# Patient Record
Sex: Female | Born: 1940 | Race: Black or African American | Hispanic: No | Smoking: Never smoker
Health system: Southern US, Community
[De-identification: ages and names within clinical notes are randomized; demographics above are authoritative.]

## PROBLEM LIST (undated history)

## (undated) DIAGNOSIS — I10 Essential (primary) hypertension: Secondary | ICD-10-CM

---

## 1997-08-12 ENCOUNTER — Other Ambulatory Visit: Admission: RE | Admit: 1997-08-12 | Discharge: 1997-08-12 | Payer: Self-pay | Admitting: *Deleted

## 1998-09-05 ENCOUNTER — Other Ambulatory Visit: Admission: RE | Admit: 1998-09-05 | Discharge: 1998-09-05 | Payer: Self-pay | Admitting: *Deleted

## 1999-08-21 ENCOUNTER — Encounter: Payer: Self-pay | Admitting: *Deleted

## 1999-08-21 ENCOUNTER — Encounter: Admission: RE | Admit: 1999-08-21 | Discharge: 1999-08-21 | Payer: Self-pay | Admitting: *Deleted

## 1999-09-28 ENCOUNTER — Other Ambulatory Visit: Admission: RE | Admit: 1999-09-28 | Discharge: 1999-09-28 | Payer: Self-pay | Admitting: *Deleted

## 1999-11-24 ENCOUNTER — Ambulatory Visit (HOSPITAL_COMMUNITY): Admission: RE | Admit: 1999-11-24 | Discharge: 1999-11-24 | Payer: Self-pay | Admitting: Gastroenterology

## 1999-11-24 ENCOUNTER — Encounter (INDEPENDENT_AMBULATORY_CARE_PROVIDER_SITE_OTHER): Payer: Self-pay | Admitting: *Deleted

## 2000-08-07 ENCOUNTER — Other Ambulatory Visit: Admission: RE | Admit: 2000-08-07 | Discharge: 2000-08-07 | Payer: Self-pay | Admitting: *Deleted

## 2000-08-22 ENCOUNTER — Encounter: Payer: Self-pay | Admitting: *Deleted

## 2000-08-22 ENCOUNTER — Encounter: Admission: RE | Admit: 2000-08-22 | Discharge: 2000-08-22 | Payer: Self-pay | Admitting: *Deleted

## 2001-08-26 ENCOUNTER — Encounter: Payer: Self-pay | Admitting: Family Medicine

## 2001-08-26 ENCOUNTER — Encounter: Admission: RE | Admit: 2001-08-26 | Discharge: 2001-08-26 | Payer: Self-pay | Admitting: Family Medicine

## 2001-08-27 ENCOUNTER — Other Ambulatory Visit: Admission: RE | Admit: 2001-08-27 | Discharge: 2001-08-27 | Payer: Self-pay | Admitting: *Deleted

## 2002-09-04 ENCOUNTER — Encounter: Admission: RE | Admit: 2002-09-04 | Discharge: 2002-09-04 | Payer: Self-pay | Admitting: Family Medicine

## 2002-09-04 ENCOUNTER — Encounter: Payer: Self-pay | Admitting: Family Medicine

## 2002-09-07 ENCOUNTER — Other Ambulatory Visit: Admission: RE | Admit: 2002-09-07 | Discharge: 2002-09-07 | Payer: Self-pay | Admitting: *Deleted

## 2003-03-22 ENCOUNTER — Ambulatory Visit (HOSPITAL_COMMUNITY): Admission: RE | Admit: 2003-03-22 | Discharge: 2003-03-22 | Payer: Self-pay | Admitting: Gastroenterology

## 2003-09-07 ENCOUNTER — Other Ambulatory Visit: Admission: RE | Admit: 2003-09-07 | Discharge: 2003-09-07 | Payer: Self-pay | Admitting: *Deleted

## 2003-09-14 ENCOUNTER — Encounter: Admission: RE | Admit: 2003-09-14 | Discharge: 2003-09-14 | Payer: Self-pay | Admitting: *Deleted

## 2004-08-30 ENCOUNTER — Other Ambulatory Visit: Admission: RE | Admit: 2004-08-30 | Discharge: 2004-08-30 | Payer: Self-pay | Admitting: *Deleted

## 2004-11-01 ENCOUNTER — Encounter: Admission: RE | Admit: 2004-11-01 | Discharge: 2004-11-01 | Payer: Self-pay | Admitting: *Deleted

## 2005-09-04 ENCOUNTER — Other Ambulatory Visit: Admission: RE | Admit: 2005-09-04 | Discharge: 2005-09-04 | Payer: Self-pay | Admitting: *Deleted

## 2005-12-12 ENCOUNTER — Ambulatory Visit: Payer: Self-pay | Admitting: Internal Medicine

## 2006-01-09 ENCOUNTER — Encounter: Payer: Self-pay | Admitting: Cardiology

## 2006-01-09 ENCOUNTER — Ambulatory Visit: Payer: Self-pay

## 2006-01-14 ENCOUNTER — Encounter: Admission: RE | Admit: 2006-01-14 | Discharge: 2006-01-14 | Payer: Self-pay | Admitting: *Deleted

## 2006-09-19 ENCOUNTER — Other Ambulatory Visit: Admission: RE | Admit: 2006-09-19 | Discharge: 2006-09-19 | Payer: Self-pay | Admitting: *Deleted

## 2007-01-16 ENCOUNTER — Encounter: Admission: RE | Admit: 2007-01-16 | Discharge: 2007-01-16 | Payer: Self-pay | Admitting: *Deleted

## 2007-02-25 ENCOUNTER — Ambulatory Visit: Payer: Self-pay | Admitting: Internal Medicine

## 2007-02-25 DIAGNOSIS — R209 Unspecified disturbances of skin sensation: Secondary | ICD-10-CM

## 2007-02-25 DIAGNOSIS — Z860100 Personal history of colon polyps, unspecified: Secondary | ICD-10-CM | POA: Insufficient documentation

## 2007-02-25 DIAGNOSIS — E785 Hyperlipidemia, unspecified: Secondary | ICD-10-CM | POA: Insufficient documentation

## 2007-02-25 DIAGNOSIS — R5381 Other malaise: Secondary | ICD-10-CM | POA: Insufficient documentation

## 2007-02-25 DIAGNOSIS — R5383 Other fatigue: Secondary | ICD-10-CM | POA: Insufficient documentation

## 2007-02-25 DIAGNOSIS — K5730 Diverticulosis of large intestine without perforation or abscess without bleeding: Secondary | ICD-10-CM | POA: Insufficient documentation

## 2007-02-25 LAB — CONVERTED CEMR LAB
BUN: 12 mg/dL (ref 6–23)
Basophils Absolute: 0 10*3/uL (ref 0.0–0.1)
Calcium: 9 mg/dL (ref 8.4–10.5)
Chloride: 107 meq/L (ref 96–112)
Eosinophils Relative: 5.1 % — ABNORMAL HIGH (ref 0.0–5.0)
Folate: 10.1 ng/mL
Hemoglobin: 14.1 g/dL (ref 12.0–15.0)
MCHC: 34.9 g/dL (ref 30.0–36.0)
MCV: 90.2 fL (ref 78.0–100.0)
Monocytes Absolute: 0.2 10*3/uL (ref 0.2–0.7)
Monocytes Relative: 6.4 % (ref 3.0–11.0)
Neutro Abs: 1.7 10*3/uL (ref 1.4–7.7)
Neutrophils Relative %: 47.9 % (ref 43.0–77.0)
Platelets: 166 10*3/uL (ref 150–400)
Potassium: 4.2 meq/L (ref 3.5–5.1)
RBC: 4.49 M/uL (ref 3.87–5.11)
Sodium: 141 meq/L (ref 135–145)
TSH: 2.56 microintl units/mL (ref 0.35–5.50)
Total Bilirubin: 0.5 mg/dL (ref 0.3–1.2)
Total CHOL/HDL Ratio: 4.4
Triglycerides: 82 mg/dL (ref 0–149)
Vitamin B-12: 646 pg/mL (ref 211–911)

## 2007-07-10 ENCOUNTER — Ambulatory Visit: Payer: Self-pay | Admitting: Internal Medicine

## 2007-07-10 ENCOUNTER — Telehealth (INDEPENDENT_AMBULATORY_CARE_PROVIDER_SITE_OTHER): Payer: Self-pay | Admitting: *Deleted

## 2007-07-10 DIAGNOSIS — R21 Rash and other nonspecific skin eruption: Secondary | ICD-10-CM | POA: Insufficient documentation

## 2007-09-29 ENCOUNTER — Other Ambulatory Visit: Admission: RE | Admit: 2007-09-29 | Discharge: 2007-09-29 | Payer: Self-pay | Admitting: Gynecology

## 2008-01-27 ENCOUNTER — Encounter: Admission: RE | Admit: 2008-01-27 | Discharge: 2008-01-27 | Payer: Self-pay | Admitting: Geriatric Medicine

## 2010-03-03 ENCOUNTER — Emergency Department (HOSPITAL_COMMUNITY)
Admission: EM | Admit: 2010-03-03 | Discharge: 2010-03-03 | Payer: Self-pay | Source: Home / Self Care | Admitting: Emergency Medicine

## 2010-03-19 ENCOUNTER — Encounter: Payer: Self-pay | Admitting: Geriatric Medicine

## 2010-07-14 NOTE — Op Note (Signed)
NAMEJAISA, DEFINO                          ACCOUNT NO.:  [id number]   MEDICAL RECORD NO.:  [id number]                   PATIENT TYPE:  AMB   LOCATION:  ENDO                                 FACILITY:  [facility]   PHYSICIAN:  John C. Madilyn Fireman, M.D.                 DATE OF BIRTH:  06-13-1940   DATE OF PROCEDURE:  03/22/2003  DATE OF DISCHARGE:                                 OPERATIVE REPORT   PROCEDURE:  Colonoscopy.   INDICATIONS FOR PROCEDURE:  History of adenomatous colon polyp.   DESCRIPTION OF PROCEDURE:  The patient was placed in the left lateral  decubitus position and placed on the pulse monitor with continuous low-flow  oxygen delivered by nasal cannula.  She was sedated with 87.5 mcg IV  fentanyl and 8 mg IV Versed.  The Olympus video colonoscope was inserted  into the rectum and advanced to the cecum, confirmed by transillumination of  McBurney's point and visualization of the ileocecal valve and appendiceal  orifice.  Prep was excellent.  The cecum appeared normal with no masses,  polyps, diverticula, or other mucosal abnormalities.  There were a few  ascending colon diverticula seen.  Otherwise no masses, polyps or other  abnormalities.  The transverse and descending colon appeared normal.  There  were a few more diverticula seen in the sigmoid colon.  The rectum appeared  normal and retroflexed view of the anus revealed no obvious internal  hemorrhoids.  The scope was then withdrawn and the patient returned to the  recovery room in stable condition.  She tolerated the procedure well and  there were no immediate complications.   IMPRESSION:  Diverticulosis; otherwise normal study.   PLAN:  Repeat colonoscopy in five years.                                               John C. Madilyn Fireman, M.D.    J[facility]/MEDQ  D:  03/22/2003  T:  03/22/2003  Job:  [id number]   cc:   Meredith Staggers, M.D.  [address]  [place]  [place] [address]  Fax: [phone]

## 2010-07-14 NOTE — Procedures (Signed)
[facility]  Patient:    Crystal Horton, Crystal Horton                       MRN: [id number] Proc. Date: 11/24/99 Adm. Date:  [id number] Attending:  Louie Bun CC:         Willis Modena. Dreiling, M.D.   Procedure Report  PROCEDURE:  Colonoscopy with polypectomy.  INDICATION FOR PROCEDURE:  Change in bowel habits and heme positive stool.  DESCRIPTION OF PROCEDURE:  The patient was placed in the left lateral decubitus position and placed on the pulse monitor with continuous low flow oxygen delivered by nasal cannula. She was sedated with 70 mg IV Demerol and 7 mg IV Versed. The Olympus video colonoscope was inserted into the rectum and advanced to the cecum, confirmed by transillumination at McBurneys point and visualization of the ileocecal valve and appendiceal orifice. The prep was good. Within the base of the cecum was seen a 1 cm sessile polyp which was removed by snare. The remainder of the cecum, ascending, transverse, descending and sigmoid colon appeared normal with no further masses, polyps, diverticula or other mucosal abnormalities. Within the rectum was seen a second similar 1 cm sessile polyp which was also removed by snare. The remainder of the rectum appeared normal and retroflexed view of the anus revealed no obvious internal hemorrhoids. The colonoscope was then withdrawn and the patient returned to the recovery room in stable condition. The patient tolerated the procedure well and there were no immediate complications.  IMPRESSION:  Cecal and ascending colon polyps.  PLAN:  Await histology for determination of need for future surveillance colonoscopies. DD:  11/24/99 TD:  11/24/99 Job: [id number]

## 2012-11-28 ENCOUNTER — Other Ambulatory Visit: Payer: Self-pay | Admitting: Geriatric Medicine

## 2012-11-28 ENCOUNTER — Ambulatory Visit
Admission: RE | Admit: 2012-11-28 | Discharge: 2012-11-28 | Disposition: A | Discharge status: Home / Self Care | Payer: Medicare Other | Source: Ambulatory Visit | Attending: Geriatric Medicine | Admitting: Geriatric Medicine

## 2012-11-28 DIAGNOSIS — M79604 Pain in right leg: Secondary | ICD-10-CM

## 2013-04-29 ENCOUNTER — Other Ambulatory Visit: Payer: Self-pay | Admitting: Gynecology

## 2013-04-29 DIAGNOSIS — R928 Other abnormal and inconclusive findings on diagnostic imaging of breast: Secondary | ICD-10-CM

## 2013-05-12 ENCOUNTER — Ambulatory Visit
Admission: RE | Admit: 2013-05-12 | Discharge: 2013-05-12 | Disposition: A | Discharge status: Home / Self Care | Payer: Medicare Other | Source: Ambulatory Visit | Attending: Gynecology | Admitting: Gynecology

## 2013-05-12 DIAGNOSIS — R928 Other abnormal and inconclusive findings on diagnostic imaging of breast: Secondary | ICD-10-CM

## 2016-04-11 ENCOUNTER — Other Ambulatory Visit: Payer: Self-pay | Admitting: Geriatric Medicine

## 2016-04-11 DIAGNOSIS — N649 Disorder of breast, unspecified: Secondary | ICD-10-CM

## 2016-05-03 ENCOUNTER — Ambulatory Visit
Admission: RE | Admit: 2016-05-03 | Discharge: 2016-05-03 | Disposition: A | Discharge status: Home / Self Care | Payer: Medicare Other | Source: Ambulatory Visit | Attending: Geriatric Medicine | Admitting: Geriatric Medicine

## 2016-05-03 ENCOUNTER — Encounter: Payer: Self-pay | Admitting: Radiology

## 2016-05-03 DIAGNOSIS — N649 Disorder of breast, unspecified: Secondary | ICD-10-CM

## 2019-05-21 ENCOUNTER — Telehealth: Payer: Self-pay

## 2019-05-21 NOTE — Telephone Encounter (Signed)
Ms. Propp wanted to schedule an appointment with Dr. Allyne Gee as a new patient, Ms. Eastland was told that she would need to establish with the NP.  Ms Naugle said that she didn't want to see an NP she wanted an MD.  Ms  Ivey was told that Dr. Allyne Gee doesn't have any openings at this time for new patients and that the office would call her when an opening is available.

## 2019-05-22 ENCOUNTER — Emergency Department (HOSPITAL_COMMUNITY): Payer: Medicare PPO

## 2019-05-22 ENCOUNTER — Other Ambulatory Visit: Payer: Self-pay

## 2019-05-22 ENCOUNTER — Emergency Department (HOSPITAL_COMMUNITY)
Admission: EM | Admit: 2019-05-22 | Discharge: 2019-05-22 | Disposition: A | Discharge status: Home / Self Care | Payer: Medicare PPO | Attending: Emergency Medicine | Admitting: Emergency Medicine

## 2019-05-22 ENCOUNTER — Encounter (HOSPITAL_COMMUNITY): Payer: Self-pay | Admitting: Pediatrics

## 2019-05-22 DIAGNOSIS — I10 Essential (primary) hypertension: Secondary | ICD-10-CM | POA: Diagnosis not present

## 2019-05-22 DIAGNOSIS — M79605 Pain in left leg: Secondary | ICD-10-CM | POA: Insufficient documentation

## 2019-05-22 HISTORY — DX: Essential (primary) hypertension: I10

## 2019-05-22 NOTE — ED Triage Notes (Signed)
C/o intermittent leg pain on left side since January. Endorsed some swelling and achy on back of the knee, worst with activity

## 2019-05-22 NOTE — ED Provider Notes (Signed)
[facility] EMERGENCY DEPARTMENT Provider Note   CSN: [id number] Arrival date & time: 05/22/19  1243     History Chief Complaint  Patient presents with  . Leg Pain    Crystal Horton is a 79 y.o. female.  HPI Patient is a 79 year old female with a PMH of hyperlipidemia presenting to the ED today due to left leg pain.  Patient reports that in January, she was lifting a heavy treadmill with her son and first experienced the pain.  She denies falling, hitting her knee or any popping/clicking sounds.  However, she has experienced significant pain in the popliteal fossa since that time.  She says that the pain has mildly improved but that it is still present.  It significantly worsens when she bears weight.  She has had no further injury to the area.  She denies fever, chills, nausea, vomiting, diarrhea or any other sick contacts.    Past Medical History:  Diagnosis Date  . Hypertension     Patient Active Problem List   Diagnosis Date Noted  . RASH-NONVESICULAR 07/10/2007  . HYPERLIPIDEMIA 02/25/2007  . DIVERTICULOSIS, COLON 02/25/2007  . FATIGUE 02/25/2007  . PARESTHESIA 02/25/2007  . COLONIC POLYPS, HX OF 02/25/2007    History reviewed. No pertinent surgical history.   OB History   No obstetric history on file.     No family history on file.  Social History   Tobacco Use  . Smoking status: Not on file  Substance Use Topics  . Alcohol use: Not on file  . Drug use: Not on file    Home Medications Prior to Admission medications   Not on File    Allergies    Penicillins  Review of Systems   Review of Systems  Constitutional: Negative for chills and fever.  HENT: Negative for ear pain and sore throat.   Eyes: Negative for pain and visual disturbance.  Respiratory: Negative for cough and shortness of breath.   Cardiovascular: Negative for chest pain and palpitations.  Gastrointestinal: Negative for abdominal pain, diarrhea, nausea and  vomiting.  Genitourinary: Negative for dysuria and hematuria.  Musculoskeletal: Positive for arthralgias. Negative for back pain and gait problem.  Skin: Negative for rash and wound.  Neurological: Negative for weakness and headaches.  Psychiatric/Behavioral: Negative for agitation.  All other systems reviewed and are negative.   Physical Exam Updated Vital Signs BP (!) 147/92 (BP Location: Right Arm)   Pulse 67   Temp 98.6 F (37 C) (Oral)   Resp 18   SpO2 99%   Physical Exam Vitals and nursing note reviewed.  Constitutional:      General: She is not in acute distress.    Appearance: Normal appearance. She is well-developed. She is not ill-appearing.  HENT:     Head: Normocephalic and atraumatic.     Right Ear: External ear normal.     Left Ear: External ear normal.     Nose: Nose normal. No congestion or rhinorrhea.     Mouth/Throat:     Mouth: Mucous membranes are moist.     Pharynx: Oropharynx is clear.  Eyes:     Extraocular Movements: Extraocular movements intact.     Pupils: Pupils are equal, round, and reactive to light.  Cardiovascular:     Rate and Rhythm: Normal rate and regular rhythm.     Pulses: Normal pulses.     Heart sounds: Normal heart sounds.  Pulmonary:     Effort: Pulmonary effort is normal. No  respiratory distress.     Breath sounds: Normal breath sounds.  Abdominal:     General: There is no distension.     Palpations: Abdomen is soft.     Tenderness: There is no abdominal tenderness.  Musculoskeletal:        General: Normal range of motion.     Cervical back: Normal range of motion and neck supple.     Comments: No tenderness to palpation of the knee or popliteal fossa.  No obvious bony deformities, swelling or masses noted.  Patient's foot is neurovascularly intact.  Skin:    General: Skin is warm and dry.     Capillary Refill: Capillary refill takes less than 2 seconds.  Neurological:     General: No focal deficit present.     Mental  Status: She is alert and oriented to person, place, and time. Mental status is at baseline.  Psychiatric:        Mood and Affect: Mood normal.     ED Results / Procedures / Treatments   Labs (all labs ordered are listed, but only abnormal results are displayed) Labs Reviewed - No data to display  EKG None  Radiology DG Knee Complete 4 Views Left  Result Date: 05/22/2019 CLINICAL DATA:  Pain and weakness EXAM: LEFT KNEE - COMPLETE 4+ VIEW COMPARISON:  None. FINDINGS: Frontal, lateral, and bilateral oblique views were obtained. There is no fracture or dislocation. No evident joint effusion. There is moderate narrowing medially with milder narrowing of the patellofemoral joint. No erosive changes. IMPRESSION: Joint space narrowing medially and to a lesser extent in the patellofemoral joint. No fracture, dislocation, or effusion. Electronically Signed   By: Bretta Bang III M.D.   On: 05/22/2019 13:30    Procedures Procedures (including critical care time)  Medications Ordered in ED Medications - No data to display  ED Course  I have reviewed the triage vital signs and the nursing notes.  Pertinent labs & imaging results that were available during my care of the patient were reviewed by me and considered in my medical decision making (see chart for details).    MDM Rules/Calculators/A&P                     Patient is a 79 year old female with a PMH of hyperlipidemia presenting to the ED today due to left knee pain.  Physical exam unremarkable.  Vital signs stable.  Afebrile.  On arrival, patient appears generally well and is displaying no signs of acute distress.  Her biggest concern is pain in the popliteal fossa of the left knee that has been present for the past 2 months.  Pain significantly worsens when she bears weight on the extremity.  X-rays collected in triage showed no bony deformities.  She has no swelling to the area and I have low suspicion for septic arthritis or  gout.  She may potentially be experiencing a Baker's cyst due to the location of the pain; although, I am not able to palpate anything in the popliteal fossa.  She may also be experiencing ligamentous injury.  She has no signs of joint laxity or instability.No further work-up or intervention required while in the ED. Encouraged symptomatic management including Tylenol and Motrin for pain.  Encouraged her to schedule appointment with outpatient orthopedics clinic if her symptoms persist.    Patient stable for discharge.  Provided strict return precautions.  Encouraged her to follow-up with orthopedics outpatient clinic as well as her PCP for  persistent symptoms.  Patient expresses understanding and is in agreement with plan.  Patient assessed and evaluated with Dr. Tamera Punt.  Nadeen Landau, MD  Final Clinical Impression(s) / ED Diagnoses Final diagnoses:  Left leg pain    Rx / DC Orders ED Discharge Orders    None       Nadeen Landau, MD 05/23/19 6759    Malvin Johns, MD 05/23/19 2051

## 2019-06-22 ENCOUNTER — Ambulatory Visit: Payer: Medicare PPO | Admitting: Nurse Practitioner

## 2019-06-22 ENCOUNTER — Telehealth: Payer: Self-pay

## 2019-06-22 ENCOUNTER — Encounter: Payer: Self-pay | Admitting: Nurse Practitioner

## 2019-06-22 ENCOUNTER — Other Ambulatory Visit: Payer: Self-pay

## 2019-06-22 VITALS — BP 122/80 | HR 84 | Temp 97.5°F | Ht 65.4 in | Wt 196.0 lb

## 2019-06-22 DIAGNOSIS — G8929 Other chronic pain: Secondary | ICD-10-CM | POA: Diagnosis not present

## 2019-06-22 DIAGNOSIS — M25562 Pain in left knee: Secondary | ICD-10-CM

## 2019-06-22 NOTE — Telephone Encounter (Signed)
Pt came in today for an appt w/JM  pt stated she only wanted to see RS and was unaware that she would not be seeing RS or at least having her next appt scheduled with RS. Pt stated it was unclear to her when she made the appt and she was not told. I did make pt aware that she spoke to the nurse KW in March 2021 about becoming established with the NP.  Pt asked for her ROI form back and left the office unhappy.

## 2019-06-22 NOTE — Progress Notes (Signed)
This visit occurred during the SARS-CoV-2 public health emergency.  Safety protocols were in place, including screening questions prior to the visit, additional usage of staff PPE, and extensive cleaning of exam room while observing appropriate contact time as indicated for disinfecting solutions.  Subjective:     Patient ID: Crystal Horton , female    DOB: November 16, 1940 , 79 y.o.   MRN: [id number]   Chief Complaint  Patient presents with  . Establish Care  . Leg Pain    patient stated she has had a sprang in her leg and it has not gotten any better    HPI  Here to establish care - she had been going to [facility] - she had been seeing Dr. Felipa Eth. 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  She is widow.  She has 3 sons.    PMH - in the past has been prediabetic, has tried statins due to irritation of her stomach.  She often takes vitamins.  She reports a history of elevated cholesterol.  Omega, cinnamon, rose, niacin, magnesium, calcium, probiotic.  Denies a history of hypertension.    [facility] - mother - stroke, father - diabetes and heart disease. Sister liver cancer, sister with breast cancer.  Brothers and sisters with diabetes  She reports she had a mammogram last year.    She was seen at [facility] in March 2021. Initial discomfort started Dec 2020.  Pain will come and go.  Denies falling.  Denies numbness or tingling.  When she had severe pain she would take Aleve. She does sit a lot at work.  Stairs causes more pain.    Leg Pain  Injury mechanism: when she was carrying a treadmill up the stairs  Pain location: left posterior leg behind knee. The quality of the pain is described as aching. Associated symptoms include an inability to bear weight (at times). The symptoms are aggravated by movement (when stand up to walk).     Past Medical History:  Diagnosis Date  .  Hypertension      Family History  Problem Relation Age of Onset  . Stroke Mother   . Diabetes Father   . Heart disease Father     No current outpatient medications on file.   Allergies  Allergen Reactions  . Penicillins      Review of Systems  Constitutional: Negative.   Respiratory: Negative.   Cardiovascular: Negative.  Negative for chest pain, palpitations and leg swelling.  Musculoskeletal:       Left leg pain   Neurological: Negative for dizziness and headaches.  Psychiatric/Behavioral: Negative.      Today's Vitals   06/22/19 1108  BP: 122/80  Pulse: 84  Temp: (!) 97.5 F (36.4 C)  TempSrc: Oral  SpO2: 98%  Weight: 196 lb (88.9 kg)  Height: 5' 5.4" (1.661 m)  PainSc: 7   PainLoc: Leg   Body mass index is 32.22 kg/m.   Objective:  Physical Exam Constitutional:      Appearance: Normal appearance.  Cardiovascular:     Rate and Rhythm: Normal rate and regular rhythm.     Pulses: Normal pulses.     Heart sounds: Normal heart sounds. No murmur.  Pulmonary:     Effort: Pulmonary effort is normal.     Breath sounds: Normal breath sounds.  Musculoskeletal:        General: Swelling (left medial  knee swelling) and deformity present. No tenderness.     Right lower leg: No edema.     Left lower leg: No edema.     Comments: Negative drawer test  Skin:    Capillary Refill: Capillary refill takes less than 2 seconds.  Neurological:     General: No focal deficit present.     Mental Status: She is alert and oriented to person, place, and time.  Psychiatric:        Mood and Affect: Mood normal.        Behavior: Behavior normal.        Thought Content: Thought content normal.        Judgment: Judgment normal.         Assessment And Plan:     1. Chronic pain of left knee  She had Xray at [facility] in March which was negative  She is negative for drawer test   She has medial swelling   Will refer to [facility] I feel she needs an MRI of her knee with  the referral they would be able to do the MRI and evaluate.  - Ambulatory referral to Orthopedic Surgery    Arnette Felts, FNP    THE PATIENT IS ENCOURAGED TO PRACTICE SOCIAL DISTANCING DUE TO THE COVID-19 PANDEMIC.

## 2019-06-23 ENCOUNTER — Telehealth: Payer: Self-pay | Admitting: Nurse Practitioner

## 2019-06-23 NOTE — Telephone Encounter (Signed)
Called patient to confirm if she would like for [place] to complete her referral to the orthopedic since she has decided not to remain at the practice for her PCP care.  We will also mail her a copy of her AWV.

## 2019-09-04 DIAGNOSIS — R300 Dysuria: Secondary | ICD-10-CM | POA: Diagnosis not present

## 2019-09-04 DIAGNOSIS — E1169 Type 2 diabetes mellitus with other specified complication: Secondary | ICD-10-CM | POA: Diagnosis not present

## 2019-09-04 DIAGNOSIS — E7800 Pure hypercholesterolemia, unspecified: Secondary | ICD-10-CM | POA: Diagnosis not present

## 2019-09-07 DIAGNOSIS — S83242D Other tear of medial meniscus, current injury, left knee, subsequent encounter: Secondary | ICD-10-CM | POA: Diagnosis not present

## 2019-09-18 DIAGNOSIS — N390 Urinary tract infection, site not specified: Secondary | ICD-10-CM | POA: Diagnosis not present

## 2019-10-01 DIAGNOSIS — S83272A Complex tear of lateral meniscus, current injury, left knee, initial encounter: Secondary | ICD-10-CM | POA: Diagnosis not present

## 2019-10-01 DIAGNOSIS — M948X6 Other specified disorders of cartilage, lower leg: Secondary | ICD-10-CM | POA: Diagnosis not present

## 2019-10-01 DIAGNOSIS — S83232A Complex tear of medial meniscus, current injury, left knee, initial encounter: Secondary | ICD-10-CM | POA: Diagnosis not present

## 2019-10-01 DIAGNOSIS — S83242A Other tear of medial meniscus, current injury, left knee, initial encounter: Secondary | ICD-10-CM | POA: Diagnosis not present

## 2019-10-01 DIAGNOSIS — S83282A Other tear of lateral meniscus, current injury, left knee, initial encounter: Secondary | ICD-10-CM | POA: Diagnosis not present

## 2019-10-23 ENCOUNTER — Ambulatory Visit: Payer: Medicare Other | Admitting: Dietician

## 2019-11-13 ENCOUNTER — Other Ambulatory Visit: Payer: Self-pay

## 2019-11-13 ENCOUNTER — Encounter: Payer: Medicare PPO | Attending: Geriatric Medicine | Admitting: Dietician

## 2019-11-13 ENCOUNTER — Encounter: Payer: Self-pay | Admitting: Dietician

## 2019-11-13 DIAGNOSIS — E119 Type 2 diabetes mellitus without complications: Secondary | ICD-10-CM | POA: Insufficient documentation

## 2019-11-13 NOTE — Patient Instructions (Addendum)
Work on finding ways to relieve your stress. Deep breathing, tend to your plants, go for short walks as your knee tolerates.  Work towards getting 8 hours of sleep a night.  Spread your servings of fruit out throughout the day.  Eat 1 per meal.  Look for a gym that may provide water aerobics. Work towards 150 minutes a week.  Read your nutrition labels, and make healthier choices with lower saturated fat and added sugars.  Choose leaner meats!  Find your balance!!

## 2019-11-13 NOTE — Progress Notes (Signed)
Medical Nutrition Therapy:  Appt start time: 0910 end time:  1000.  Pt arrived 40 minutes late, abridged appointment  Assessment:  Primary concerns today: Diabetes.   Pt expects to get nutrition advice and meal plan ideas for her diabetes. Pt uses deep breathing for stress, short walks, takes care of her plants. Has a knee injury that hinders movement at times. Pt reports wanting to get away from having a big breakfast. Pt has a light lunch then feels hungry around 3 pm.  Pt states that is when she binges sometimes on cheese and chips, will go back for 2 or 3 servings. Pt states that she feels guilty about eating sometimes because it will make her gain weight. Pt reports getting only 4-5 hours of sleep per day.   Pt wants to get back into exercising, has tried videos but they haven't worked well. Used to do 30-40 minutes on her treadmill every day before her knee injury.    Preferred Learning Style:   No preference indicated   Learning Readiness:   Ready  MEDICATIONS: N/A   DIETARY INTAKE:  Usual eating pattern includes 2 meals and 2 snacks per day.  Everyday foods include 3-4 fruits a day.    24-hr recall:  B ( AM): 2 egg whites, 1 yolk with spinach and cheese, 2 link sausages, 1 slice of bacon, 1 slice of toast. water Snk ( AM): None  L ( PM): 1 slice bread, Lettuce, sliced [place] and cheese. Green tea Snk ( PM): Potato chips/cheetos, or nuts or fruits D ( PM): 2 slices of thin crust pizza, pepperoni and sausage, Snk ( PM):  1 small orange, apple grapes, pecans Beverages: Green tea, water  Usual physical activity: ADLs   Progress Towards Goal(s):  In progress.   Nutritional Diagnosis:  NB-1.1 Food and nutrition-related knowledge deficit As related to diabetes.  As evidenced by A1c of 6.6, and consuming large amounts of carbohydrates at once..    Intervention:  Nutrition Education. Educated patient on the pathophysiology of diabetes. This includes why our bodies need  circulating blood sugar, the relationship between insulin and blood sugar, and the results of insulin resistance and/or pancreatic insufficiency on the development of diabetes. Educated patient on factors that contribute to elevation of blood sugars, such as stress, illness, injury,and food choices. Discussed the role that physical activity plays in lowering blood sugar. Educate patient on the three main macronutrients. Protein, fats, and carbohydrates. Discussed how each of these macronutrients affect blood sugar levels, especially carbohydrate, and the importance of eating a consistent amount of carbohydrate throughout the day. Counsel patient on focusing on lifestyle and dietary changes to improve her health via blood sugar control and lowered cholesterol, instead of focusing on weight.  Goals:  Work on finding ways to relieve your stress. Deep breathing, tend to your plants, go for short walks as your knee tolerates.  Work towards getting 8 hours of sleep a night.  Spread your servings of fruit out throughout the day.   Eat 1 per meal. . Eat three meals a day o 4-5 hours apart o Consistent carbohydrates  1/2 plate non-starchy vegetables, 1/4 plate lean protein, 1/4 plate starches  Look for a gym that may provide water aerobics.  Work towards 150 minutes a week.  Read your nutrition labels, and make healthier choices with lower saturated fat and added sugars.  Choose leaner meats!  Find your balance!!  Teaching Method Utilized:  Visual Auditory   Handouts given during visit  include:  ADA How to Thrive Book  Diabetic MyPlate  Label Reading Tips  Barriers to learning/adherence to lifestyle change: None  Demonstrated degree of understanding via:  Teach Back   Monitoring/Evaluation:  Dietary intake, exercise, and body weight in 2 month(s).

## 2019-11-27 DIAGNOSIS — R262 Difficulty in walking, not elsewhere classified: Secondary | ICD-10-CM | POA: Diagnosis not present

## 2019-11-27 DIAGNOSIS — M6281 Muscle weakness (generalized): Secondary | ICD-10-CM | POA: Diagnosis not present

## 2019-11-30 DIAGNOSIS — R262 Difficulty in walking, not elsewhere classified: Secondary | ICD-10-CM | POA: Diagnosis not present

## 2019-11-30 DIAGNOSIS — M6281 Muscle weakness (generalized): Secondary | ICD-10-CM | POA: Diagnosis not present

## 2019-12-02 DIAGNOSIS — M6281 Muscle weakness (generalized): Secondary | ICD-10-CM | POA: Diagnosis not present

## 2019-12-02 DIAGNOSIS — R262 Difficulty in walking, not elsewhere classified: Secondary | ICD-10-CM | POA: Diagnosis not present

## 2019-12-09 DIAGNOSIS — M6281 Muscle weakness (generalized): Secondary | ICD-10-CM | POA: Diagnosis not present

## 2019-12-09 DIAGNOSIS — R262 Difficulty in walking, not elsewhere classified: Secondary | ICD-10-CM | POA: Diagnosis not present

## 2019-12-22 DIAGNOSIS — E119 Type 2 diabetes mellitus without complications: Secondary | ICD-10-CM | POA: Diagnosis not present

## 2020-01-01 DIAGNOSIS — S83242D Other tear of medial meniscus, current injury, left knee, subsequent encounter: Secondary | ICD-10-CM | POA: Diagnosis not present

## 2020-01-08 ENCOUNTER — Ambulatory Visit: Payer: Medicare Other | Admitting: Dietician

## 2020-03-07 DIAGNOSIS — D259 Leiomyoma of uterus, unspecified: Secondary | ICD-10-CM | POA: Diagnosis not present

## 2020-03-07 DIAGNOSIS — R109 Unspecified abdominal pain: Secondary | ICD-10-CM | POA: Diagnosis not present

## 2020-03-07 DIAGNOSIS — R1032 Left lower quadrant pain: Secondary | ICD-10-CM | POA: Diagnosis not present

## 2020-03-07 DIAGNOSIS — N921 Excessive and frequent menstruation with irregular cycle: Secondary | ICD-10-CM | POA: Diagnosis not present

## 2020-04-15 DIAGNOSIS — Z1239 Encounter for other screening for malignant neoplasm of breast: Secondary | ICD-10-CM | POA: Diagnosis not present

## 2020-04-15 DIAGNOSIS — Z1231 Encounter for screening mammogram for malignant neoplasm of breast: Secondary | ICD-10-CM | POA: Diagnosis not present

## 2020-04-15 DIAGNOSIS — Z124 Encounter for screening for malignant neoplasm of cervix: Secondary | ICD-10-CM | POA: Diagnosis not present

## 2020-04-15 DIAGNOSIS — Z01419 Encounter for gynecological examination (general) (routine) without abnormal findings: Secondary | ICD-10-CM | POA: Diagnosis not present

## 2020-04-15 DIAGNOSIS — R102 Pelvic and perineal pain: Secondary | ICD-10-CM | POA: Diagnosis not present

## 2020-04-15 DIAGNOSIS — Z13820 Encounter for screening for osteoporosis: Secondary | ICD-10-CM | POA: Diagnosis not present

## 2020-08-08 ENCOUNTER — Encounter: Payer: Self-pay | Admitting: Nurse Practitioner

## 2020-09-14 DIAGNOSIS — Z79899 Other long term (current) drug therapy: Secondary | ICD-10-CM | POA: Diagnosis not present

## 2020-09-14 DIAGNOSIS — Z1159 Encounter for screening for other viral diseases: Secondary | ICD-10-CM | POA: Diagnosis not present

## 2020-09-14 DIAGNOSIS — Z1389 Encounter for screening for other disorder: Secondary | ICD-10-CM | POA: Diagnosis not present

## 2020-09-14 DIAGNOSIS — E7800 Pure hypercholesterolemia, unspecified: Secondary | ICD-10-CM | POA: Diagnosis not present

## 2020-09-14 DIAGNOSIS — D708 Other neutropenia: Secondary | ICD-10-CM | POA: Diagnosis not present

## 2020-09-14 DIAGNOSIS — Z23 Encounter for immunization: Secondary | ICD-10-CM | POA: Diagnosis not present

## 2020-09-14 DIAGNOSIS — E1169 Type 2 diabetes mellitus with other specified complication: Secondary | ICD-10-CM | POA: Diagnosis not present

## 2020-09-14 DIAGNOSIS — Z0000 Encounter for general adult medical examination without abnormal findings: Secondary | ICD-10-CM | POA: Diagnosis not present

## 2020-12-15 DIAGNOSIS — E1169 Type 2 diabetes mellitus with other specified complication: Secondary | ICD-10-CM | POA: Diagnosis not present

## 2021-04-27 DIAGNOSIS — Z01419 Encounter for gynecological examination (general) (routine) without abnormal findings: Secondary | ICD-10-CM | POA: Diagnosis not present

## 2021-04-27 DIAGNOSIS — Z1239 Encounter for other screening for malignant neoplasm of breast: Secondary | ICD-10-CM | POA: Diagnosis not present

## 2021-04-27 DIAGNOSIS — Z1231 Encounter for screening mammogram for malignant neoplasm of breast: Secondary | ICD-10-CM | POA: Diagnosis not present

## 2021-09-22 DIAGNOSIS — Z79899 Other long term (current) drug therapy: Secondary | ICD-10-CM | POA: Diagnosis not present

## 2021-09-22 DIAGNOSIS — D708 Other neutropenia: Secondary | ICD-10-CM | POA: Diagnosis not present

## 2021-09-22 DIAGNOSIS — Z0000 Encounter for general adult medical examination without abnormal findings: Secondary | ICD-10-CM | POA: Diagnosis not present

## 2021-09-22 DIAGNOSIS — E7800 Pure hypercholesterolemia, unspecified: Secondary | ICD-10-CM | POA: Diagnosis not present

## 2021-09-22 DIAGNOSIS — R946 Abnormal results of thyroid function studies: Secondary | ICD-10-CM | POA: Diagnosis not present

## 2021-09-22 DIAGNOSIS — E1169 Type 2 diabetes mellitus with other specified complication: Secondary | ICD-10-CM | POA: Diagnosis not present

## 2021-10-19 DIAGNOSIS — E1165 Type 2 diabetes mellitus with hyperglycemia: Secondary | ICD-10-CM | POA: Diagnosis not present

## 2021-11-19 DIAGNOSIS — E1165 Type 2 diabetes mellitus with hyperglycemia: Secondary | ICD-10-CM | POA: Diagnosis not present

## 2021-12-17 IMAGING — CR DG KNEE COMPLETE 4+V*L*
4 series · 4 of 4 positions shown · non-contrast
Comparison: None.

CLINICAL DATA: Pain and weakness

EXAM:
LEFT KNEE - COMPLETE 4+ VIEW

[knee ap]
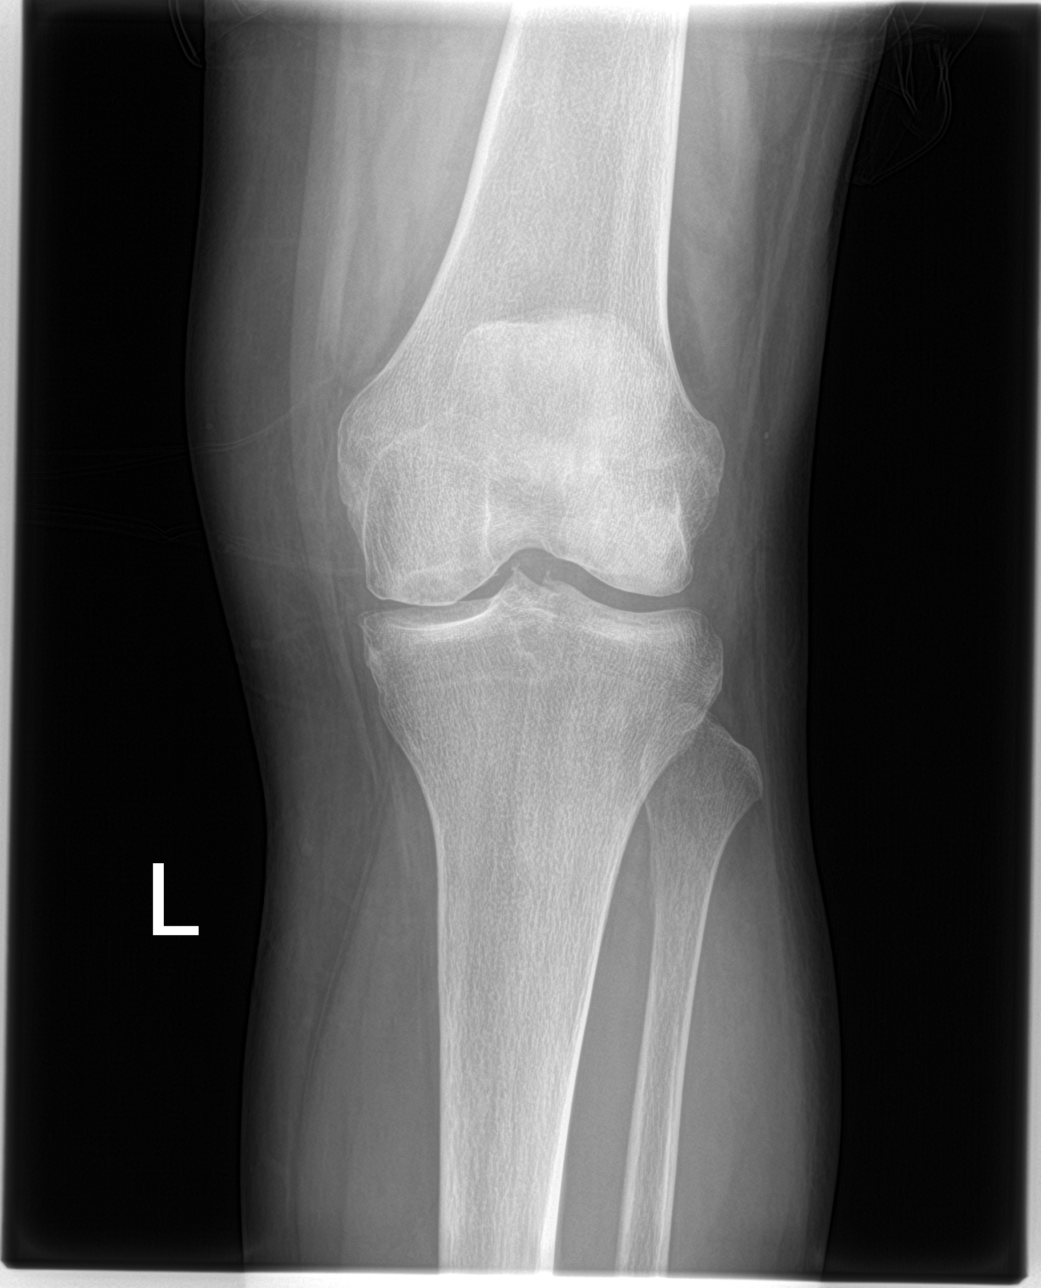

[tunnel]
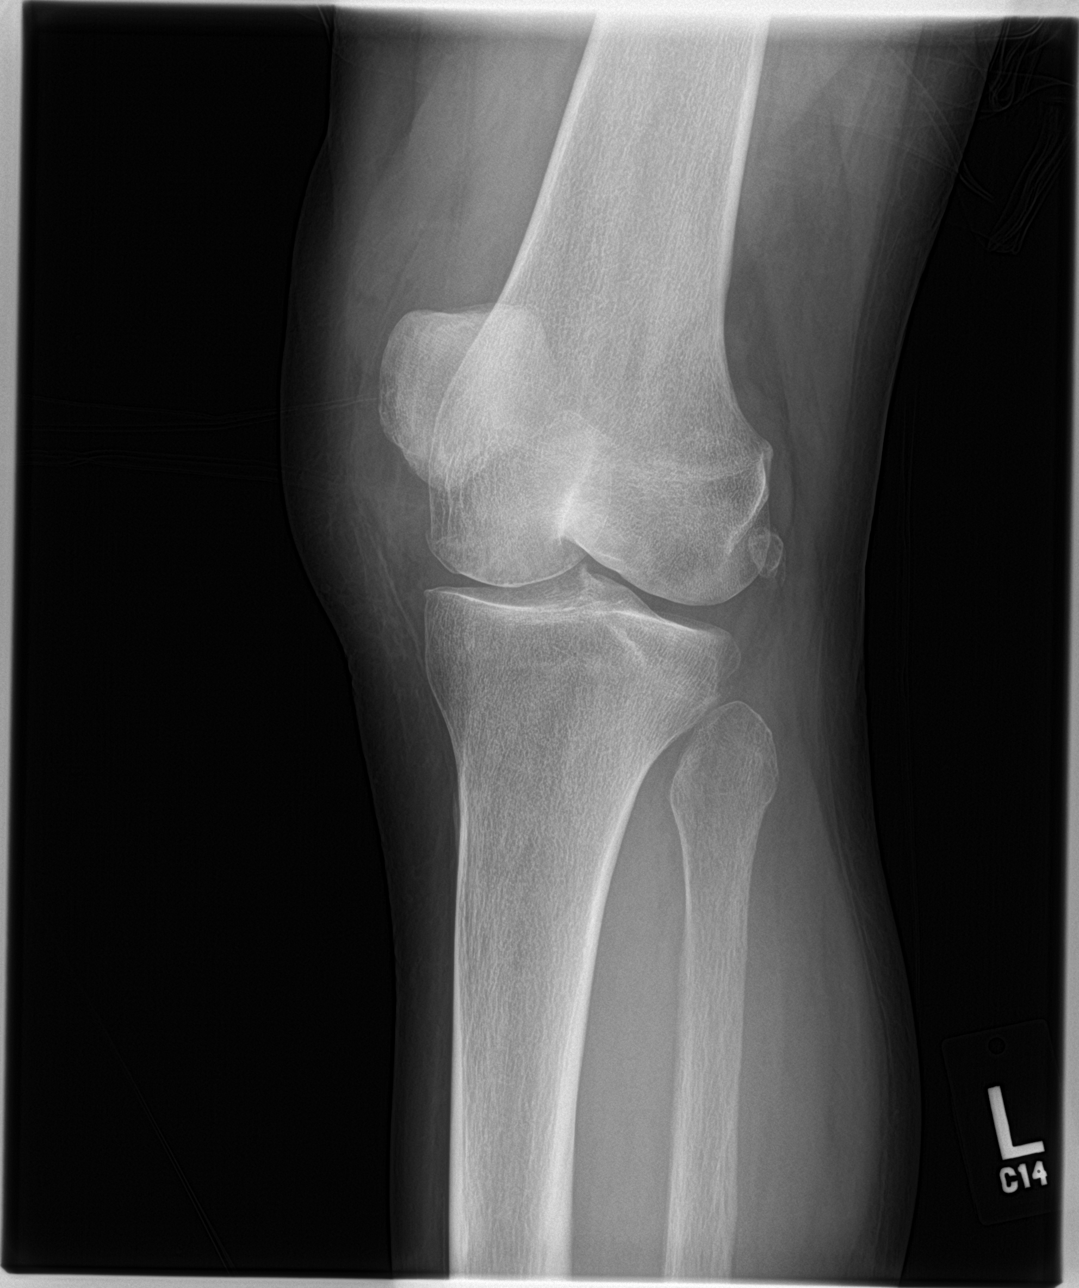

[knee lat]
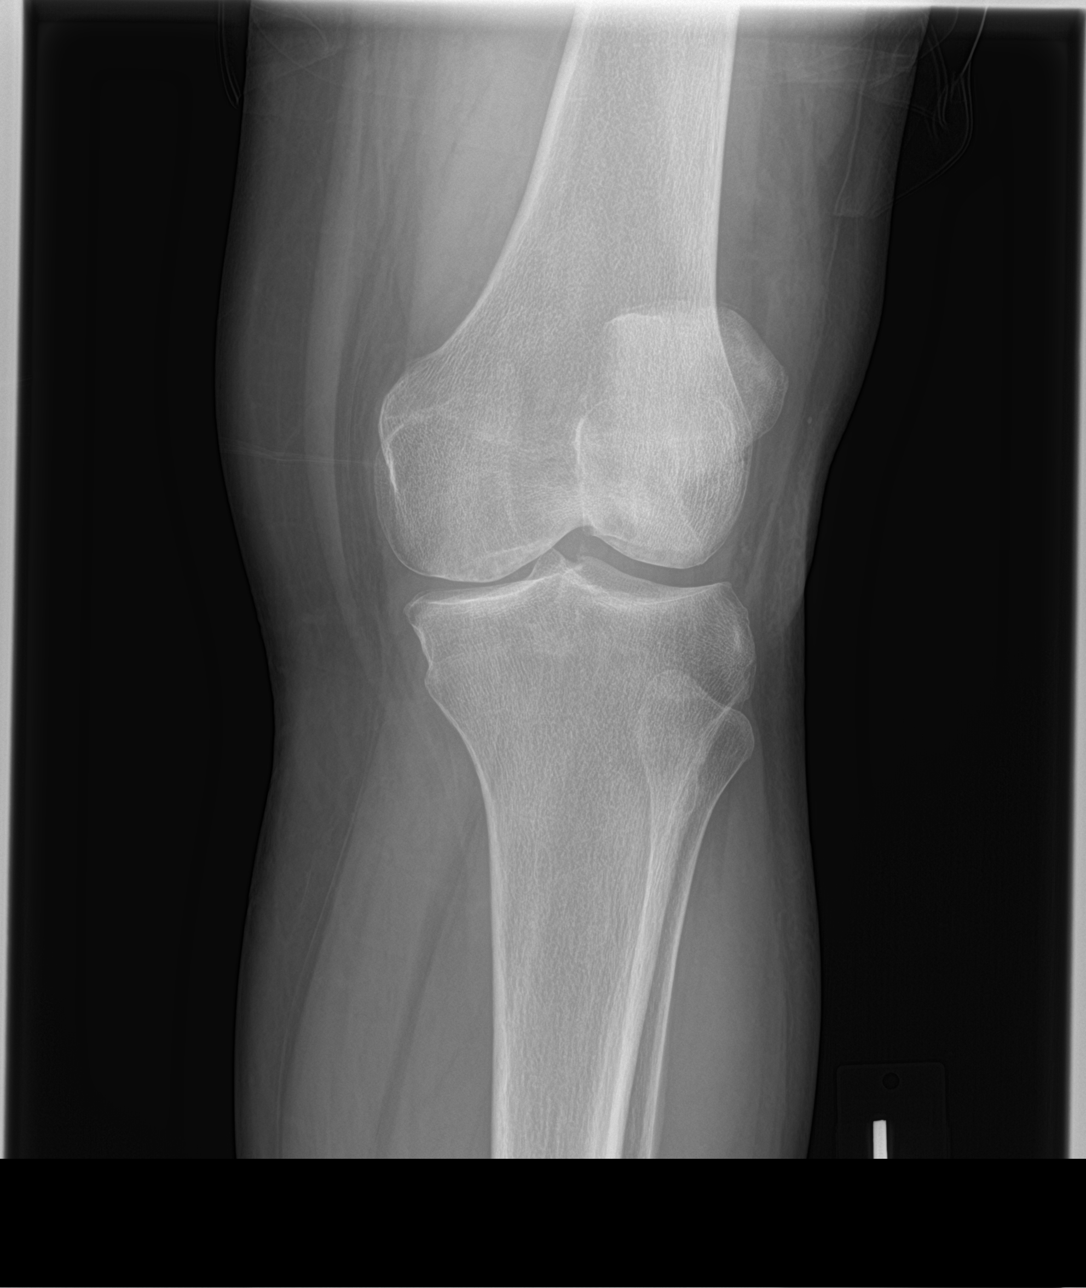

[knee obl]
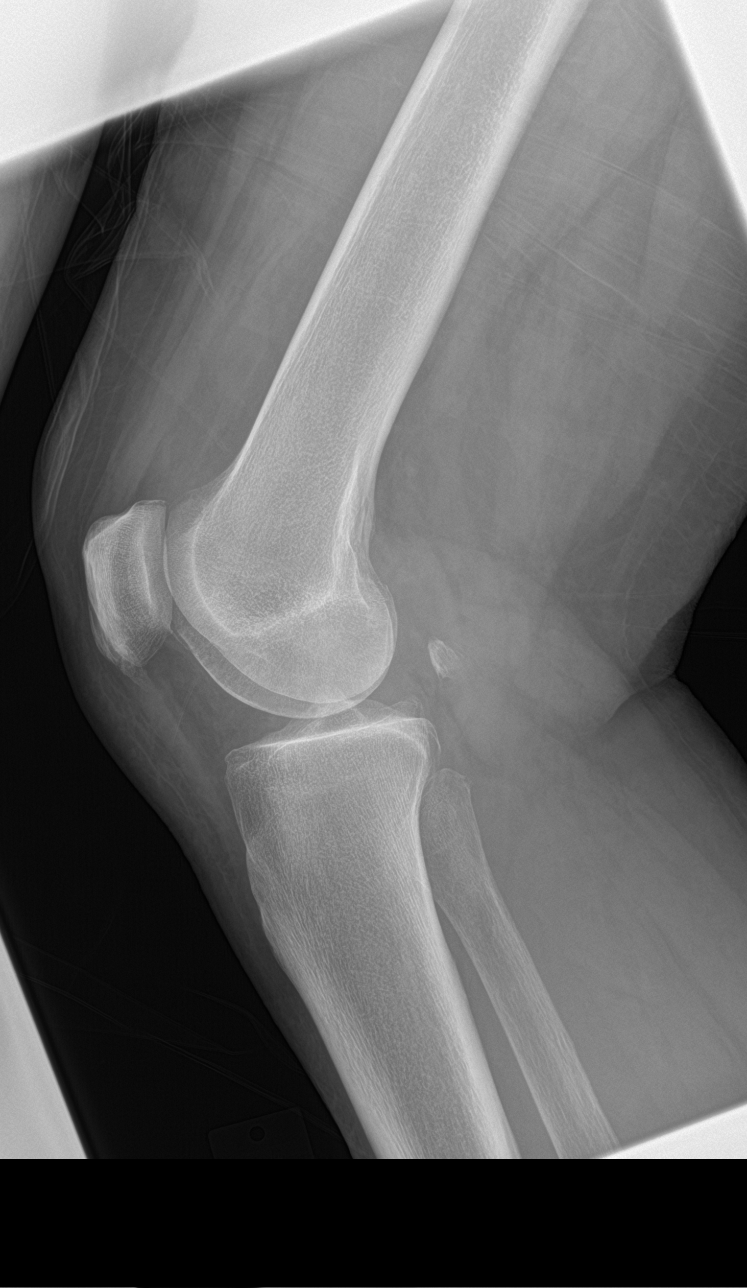

[4 of 4 positions shown; findings below may reference images not displayed]

FINDINGS: Frontal, lateral, and bilateral oblique views were obtained. There
is no fracture or dislocation. No evident joint effusion. There is
moderate narrowing medially with milder narrowing of the
patellofemoral joint. No erosive changes.
IMPRESSION: Joint space narrowing medially and to a lesser extent in the
patellofemoral joint. No fracture, dislocation, or effusion.

## 2022-01-12 DIAGNOSIS — E7800 Pure hypercholesterolemia, unspecified: Secondary | ICD-10-CM | POA: Diagnosis not present

## 2022-01-12 DIAGNOSIS — R7989 Other specified abnormal findings of blood chemistry: Secondary | ICD-10-CM | POA: Diagnosis not present

## 2022-01-12 DIAGNOSIS — I10 Essential (primary) hypertension: Secondary | ICD-10-CM | POA: Diagnosis not present

## 2022-01-12 DIAGNOSIS — E1169 Type 2 diabetes mellitus with other specified complication: Secondary | ICD-10-CM | POA: Diagnosis not present

## 2022-03-27 ENCOUNTER — Other Ambulatory Visit: Payer: Self-pay | Admitting: Internal Medicine

## 2022-03-27 DIAGNOSIS — R519 Headache, unspecified: Secondary | ICD-10-CM

## 2022-05-15 ENCOUNTER — Other Ambulatory Visit: Payer: Self-pay | Admitting: Internal Medicine

## 2022-05-15 DIAGNOSIS — R519 Headache, unspecified: Secondary | ICD-10-CM

## 2022-06-01 DIAGNOSIS — Z1239 Encounter for other screening for malignant neoplasm of breast: Secondary | ICD-10-CM | POA: Diagnosis not present

## 2022-06-01 DIAGNOSIS — Z780 Asymptomatic menopausal state: Secondary | ICD-10-CM | POA: Diagnosis not present

## 2022-06-01 DIAGNOSIS — Z01419 Encounter for gynecological examination (general) (routine) without abnormal findings: Secondary | ICD-10-CM | POA: Diagnosis not present

## 2022-06-08 ENCOUNTER — Other Ambulatory Visit: Payer: Self-pay | Admitting: Internal Medicine

## 2022-06-08 ENCOUNTER — Ambulatory Visit
Admission: RE | Admit: 2022-06-08 | Discharge: 2022-06-08 | Disposition: A | Discharge status: Home / Self Care | Payer: Medicare PPO | Source: Ambulatory Visit | Attending: Internal Medicine | Admitting: Internal Medicine

## 2022-06-08 DIAGNOSIS — R519 Headache, unspecified: Secondary | ICD-10-CM

## 2022-06-29 DIAGNOSIS — I10 Essential (primary) hypertension: Secondary | ICD-10-CM | POA: Diagnosis not present

## 2022-06-29 DIAGNOSIS — G44209 Tension-type headache, unspecified, not intractable: Secondary | ICD-10-CM | POA: Diagnosis not present

## 2022-06-29 DIAGNOSIS — E1169 Type 2 diabetes mellitus with other specified complication: Secondary | ICD-10-CM | POA: Diagnosis not present

## 2022-06-29 DIAGNOSIS — E7800 Pure hypercholesterolemia, unspecified: Secondary | ICD-10-CM | POA: Diagnosis not present

## 2022-06-29 DIAGNOSIS — E119 Type 2 diabetes mellitus without complications: Secondary | ICD-10-CM | POA: Diagnosis not present

## 2023-03-07 DIAGNOSIS — I10 Essential (primary) hypertension: Secondary | ICD-10-CM | POA: Diagnosis not present

## 2023-03-07 DIAGNOSIS — D708 Other neutropenia: Secondary | ICD-10-CM | POA: Diagnosis not present

## 2023-03-07 DIAGNOSIS — E1169 Type 2 diabetes mellitus with other specified complication: Secondary | ICD-10-CM | POA: Diagnosis not present

## 2023-03-07 DIAGNOSIS — E1142 Type 2 diabetes mellitus with diabetic polyneuropathy: Secondary | ICD-10-CM | POA: Diagnosis not present

## 2023-05-07 DIAGNOSIS — M25569 Pain in unspecified knee: Secondary | ICD-10-CM | POA: Diagnosis not present

## 2023-05-07 DIAGNOSIS — I10 Essential (primary) hypertension: Secondary | ICD-10-CM | POA: Diagnosis not present

## 2023-05-07 DIAGNOSIS — E1169 Type 2 diabetes mellitus with other specified complication: Secondary | ICD-10-CM | POA: Diagnosis not present

## 2023-05-07 DIAGNOSIS — E7800 Pure hypercholesterolemia, unspecified: Secondary | ICD-10-CM | POA: Diagnosis not present

## 2023-05-08 DIAGNOSIS — M25569 Pain in unspecified knee: Secondary | ICD-10-CM | POA: Diagnosis not present

## 2023-07-25 DIAGNOSIS — E1169 Type 2 diabetes mellitus with other specified complication: Secondary | ICD-10-CM | POA: Diagnosis not present

## 2023-07-25 DIAGNOSIS — E7800 Pure hypercholesterolemia, unspecified: Secondary | ICD-10-CM | POA: Diagnosis not present

## 2023-07-25 DIAGNOSIS — I10 Essential (primary) hypertension: Secondary | ICD-10-CM | POA: Diagnosis not present

## 2023-07-25 DIAGNOSIS — D708 Other neutropenia: Secondary | ICD-10-CM | POA: Diagnosis not present

## 2023-07-29 DIAGNOSIS — Z1231 Encounter for screening mammogram for malignant neoplasm of breast: Secondary | ICD-10-CM | POA: Diagnosis not present

## 2023-07-29 DIAGNOSIS — Z124 Encounter for screening for malignant neoplasm of cervix: Secondary | ICD-10-CM | POA: Diagnosis not present

## 2023-07-29 DIAGNOSIS — Z01419 Encounter for gynecological examination (general) (routine) without abnormal findings: Secondary | ICD-10-CM | POA: Diagnosis not present

## 2023-12-10 DIAGNOSIS — Z1331 Encounter for screening for depression: Secondary | ICD-10-CM | POA: Diagnosis not present

## 2023-12-10 DIAGNOSIS — E1169 Type 2 diabetes mellitus with other specified complication: Secondary | ICD-10-CM | POA: Diagnosis not present

## 2023-12-10 DIAGNOSIS — Z136 Encounter for screening for cardiovascular disorders: Secondary | ICD-10-CM | POA: Diagnosis not present

## 2023-12-10 DIAGNOSIS — E7800 Pure hypercholesterolemia, unspecified: Secondary | ICD-10-CM | POA: Diagnosis not present

## 2023-12-10 DIAGNOSIS — I10 Essential (primary) hypertension: Secondary | ICD-10-CM | POA: Diagnosis not present

## 2023-12-10 DIAGNOSIS — M25569 Pain in unspecified knee: Secondary | ICD-10-CM | POA: Diagnosis not present

## 2023-12-10 DIAGNOSIS — D708 Other neutropenia: Secondary | ICD-10-CM | POA: Diagnosis not present

## 2023-12-10 DIAGNOSIS — R7989 Other specified abnormal findings of blood chemistry: Secondary | ICD-10-CM | POA: Diagnosis not present

## 2023-12-10 DIAGNOSIS — Z0000 Encounter for general adult medical examination without abnormal findings: Secondary | ICD-10-CM | POA: Diagnosis not present
